# Patient Record
Sex: Female | Born: 1990 | Race: White | Hispanic: No | Marital: Single | Smoking: Current every day smoker
Health system: Southern US, Community
[De-identification: ages and names within clinical notes are randomized; demographics above are authoritative.]

## PROBLEM LIST (undated history)

## (undated) DIAGNOSIS — F329 Major depressive disorder, single episode, unspecified: Secondary | ICD-10-CM

## (undated) DIAGNOSIS — F419 Anxiety disorder, unspecified: Secondary | ICD-10-CM

## (undated) DIAGNOSIS — O039 Complete or unspecified spontaneous abortion without complication: Secondary | ICD-10-CM

## (undated) DIAGNOSIS — F32A Depression, unspecified: Secondary | ICD-10-CM

## (undated) HISTORY — PX: BREAST SURGERY: SHX581

## (undated) HISTORY — DX: Anxiety disorder, unspecified: F41.9

## (undated) HISTORY — DX: Major depressive disorder, single episode, unspecified: F32.9

## (undated) HISTORY — DX: Depression, unspecified: F32.A

## (undated) HISTORY — PX: INDUCED ABORTION: SHX677

---

## 2012-12-14 ENCOUNTER — Emergency Department (HOSPITAL_COMMUNITY)
Admission: EM | Admit: 2012-12-14 | Discharge: 2012-12-15 | Disposition: A | Discharge status: Home / Self Care | Payer: Self-pay | Attending: Emergency Medicine | Admitting: Emergency Medicine

## 2012-12-14 ENCOUNTER — Encounter (HOSPITAL_COMMUNITY): Payer: Self-pay | Admitting: Adult Health

## 2012-12-14 DIAGNOSIS — R0789 Other chest pain: Secondary | ICD-10-CM | POA: Insufficient documentation

## 2012-12-14 DIAGNOSIS — Y9389 Activity, other specified: Secondary | ICD-10-CM | POA: Insufficient documentation

## 2012-12-14 DIAGNOSIS — T43601A Poisoning by unspecified psychostimulants, accidental (unintentional), initial encounter: Secondary | ICD-10-CM | POA: Insufficient documentation

## 2012-12-14 DIAGNOSIS — F411 Generalized anxiety disorder: Secondary | ICD-10-CM | POA: Insufficient documentation

## 2012-12-14 DIAGNOSIS — Y929 Unspecified place or not applicable: Secondary | ICD-10-CM | POA: Insufficient documentation

## 2012-12-14 DIAGNOSIS — R079 Chest pain, unspecified: Secondary | ICD-10-CM | POA: Insufficient documentation

## 2012-12-14 DIAGNOSIS — R259 Unspecified abnormal involuntary movements: Secondary | ICD-10-CM | POA: Insufficient documentation

## 2012-12-14 DIAGNOSIS — F17200 Nicotine dependence, unspecified, uncomplicated: Secondary | ICD-10-CM | POA: Insufficient documentation

## 2012-12-14 DIAGNOSIS — E876 Hypokalemia: Secondary | ICD-10-CM

## 2012-12-14 DIAGNOSIS — R42 Dizziness and giddiness: Secondary | ICD-10-CM | POA: Insufficient documentation

## 2012-12-14 DIAGNOSIS — T43614A Poisoning by caffeine, undetermined, initial encounter: Secondary | ICD-10-CM | POA: Insufficient documentation

## 2012-12-14 DIAGNOSIS — Z3202 Encounter for pregnancy test, result negative: Secondary | ICD-10-CM | POA: Insufficient documentation

## 2012-12-14 DIAGNOSIS — M79609 Pain in unspecified limb: Secondary | ICD-10-CM | POA: Insufficient documentation

## 2012-12-14 NOTE — ED Notes (Addendum)
Presents with chest pain described as burning and heart racing. Reports Meth use yesterday and drinking a redbull and taking caffeine pills today. Pt is tachycardic 120s-160s  and feels SOB and dizzy.  Reports left leg pain and feeling faint.

## 2012-12-15 LAB — CBC
MCV: 78.4 fL (ref 78.0–100.0)
Platelets: 309 10*3/uL (ref 150–400)
RBC: 5.51 MIL/uL — ABNORMAL HIGH (ref 3.87–5.11)
RDW: 12.3 % (ref 11.5–15.5)
WBC: 9.1 10*3/uL (ref 4.0–10.5)

## 2012-12-15 LAB — PREGNANCY, URINE: Preg Test, Ur: NEGATIVE

## 2012-12-15 LAB — BASIC METABOLIC PANEL
CO2: 21 mEq/L (ref 19–32)
Chloride: 98 mEq/L (ref 96–112)
Creatinine, Ser: 0.75 mg/dL (ref 0.50–1.10)
GFR calc Af Amer: >90 mL/min (ref >90)
Potassium: 2.8 mEq/L — ABNORMAL LOW (ref 3.5–5.1)
Sodium: 138 mEq/L (ref 135–145)

## 2012-12-15 LAB — CK TOTAL AND CKMB (NOT AT ARMC)
CK, MB: 2.9 ng/mL (ref 0.3–4.0)
Relative Index: 2 (ref 0.0–2.5)
Total CK: 148 U/L (ref 7–177)

## 2012-12-15 LAB — RAPID URINE DRUG SCREEN, HOSP PERFORMED: Barbiturates: NOT DETECTED

## 2012-12-15 LAB — POCT I-STAT TROPONIN I: Troponin i, poc: 0 ng/mL (ref 0.00–0.08)

## 2012-12-15 MED ORDER — POTASSIUM CHLORIDE 20 MEQ/15ML (10%) PO LIQD
40.0000 meq | Freq: Once | ORAL | Status: AC
  Administered 2012-12-15: 40 meq via ORAL
  Filled 2012-12-15: qty 30

## 2012-12-15 MED ORDER — SODIUM CHLORIDE 0.9 % IV BOLUS (SEPSIS)
1000.0000 mL | Freq: Once | INTRAVENOUS | Status: AC
  Administered 2012-12-15: 1000 mL via INTRAVENOUS

## 2012-12-15 MED ORDER — POTASSIUM CHLORIDE CRYS ER 20 MEQ PO TBCR
40.0000 meq | EXTENDED_RELEASE_TABLET | Freq: Once | ORAL | Status: DC
  Filled 2012-12-15: qty 2

## 2012-12-15 MED ORDER — POTASSIUM CHLORIDE 20 MEQ/15ML (10%) PO LIQD: 40.0000 meq | Freq: Every day | ORAL | Status: DC

## 2012-12-15 MED ORDER — LORAZEPAM 2 MG/ML IJ SOLN
1.0000 mg | Freq: Once | INTRAMUSCULAR | Status: AC
  Administered 2012-12-15: 1 mg via INTRAVENOUS
  Filled 2012-12-15: qty 1

## 2012-12-15 MED ORDER — GI COCKTAIL ~~LOC~~
30.0000 mL | Freq: Once | ORAL | Status: AC
  Administered 2012-12-15: 30 mL via ORAL
  Filled 2012-12-15: qty 30

## 2012-12-15 MED ORDER — POTASSIUM CHLORIDE 10 MEQ/100ML IV SOLN
10.0000 meq | INTRAVENOUS | Status: DC
  Administered 2012-12-15: 10 meq via INTRAVENOUS
  Filled 2012-12-15: qty 100

## 2012-12-15 NOTE — ED Provider Notes (Signed)
History     CSN: [id number]  Arrival date & time 12/14/12  2333   First MD Initiated Contact with Patient 12/14/12 2355      Chief Complaint  Patient presents with  . Tachycardia    (Consider location/radiation/quality/duration/timing/severity/associated sxs/prior treatment) HPI 22 yo female presents to the ER from home with complaint of shortness of breath, chest pain/tightness and racing heart starting about 930 pm tonight.  Pt reports taking a "fatburner" B4 and drinking a 8 oz redbull with it.  About an hour after taking it she developed symptoms.  Pt feels very dizzy, like she is going to pass out.  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  She reports some left leg pain and twitching throughout the day today, no weakness, no skin changes, no injury, no swelling.  History reviewed. No pertinent past medical history.  History reviewed. No pertinent past surgical history.  History reviewed. No pertinent family history.  History  Substance Use Topics  . Smoking status: Current Every Day Smoker    Types: Cigarettes  . Smokeless tobacco: Not on file  . Alcohol Use: Yes    OB History   Grav Para Term Preterm Abortions TAB SAB Ect Mult Living                  Review of Systems  See History of Present Illness; otherwise all other systems are reviewed and negative Allergies  Morphine and related and Percocet  Home Medications  No current outpatient prescriptions on file.  BP 144/120  Pulse 93  Temp(Src) 98.1 F (36.7 C) (Oral)  Resp 14  SpO2 100%  Physical Exam  Nursing note and vitals reviewed. Constitutional: She is oriented to person, place, and time. She appears well-developed and well-nourished. She appears distressed.  HENT:  Head: Normocephalic and atraumatic.  Right Ear: External ear normal.  Left Ear: External ear normal.  Nose: Nose normal.  Mouth/Throat: Oropharynx is clear and moist.  Eyes: Conjunctivae and EOM are normal. Pupils are equal,  round, and reactive to light.  Neck: Normal range of motion. Neck supple. No JVD present. No tracheal deviation present. No thyromegaly present.  Cardiovascular: Regular rhythm, normal heart sounds and intact distal pulses.  Exam reveals no gallop and no friction rub.   No murmur heard. Tachycardia and hypertension noted  Pulmonary/Chest: Effort normal and breath sounds normal. No stridor. No respiratory distress. She has no wheezes. She has no rales. She exhibits no tenderness.  Abdominal: Soft. Bowel sounds are normal. She exhibits no distension and no mass. There is tenderness (mild epigastric tenderness). There is no rebound and no guarding.  Musculoskeletal: Normal range of motion. She exhibits no edema and no tenderness.  Lymphadenopathy:    She has no cervical adenopathy.  Neurological: She is alert and oriented to person, place, and time. No cranial nerve deficit. She exhibits normal muscle tone. Coordination normal.  Skin: Skin is warm and dry. No rash noted. No erythema. No pallor.  Psychiatric: Her behavior is normal. Judgment and thought content normal.  Patient is extremely anxious    ED Course  Procedures (including critical care time)  Labs Reviewed  CBC - Abnormal; Notable for the following:    RBC 5.51 (*)    Hemoglobin 16.6 (*)    MCHC 38.4 (*)    All other components within normal limits  BASIC METABOLIC PANEL - Abnormal; Notable for the following:    Potassium 2.8 (*)    Glucose, Bld  129 (*)    Calcium 11.0 (*)    All other components within normal limits  URINE RAPID DRUG SCREEN (HOSP PERFORMED) - Abnormal; Notable for the following:    Amphetamines POSITIVE (*)    All other components within normal limits  CK TOTAL AND CKMB  PREGNANCY, URINE  POCT I-STAT TROPONIN I   No results found.   Date: 12/15/2012  Rate: 127  Rhythm: sinus tachycardia  QRS Axis: normal  Intervals: QT prolonged  ST/T Wave abnormalities: normal  Conduction Disutrbances:none   Narrative Interpretation:   Old EKG Reviewed: none available    1. Caffeine toxicity, initial encounter   2. Hypokalemia       MDM  Patient with tachycardia, shortness of breath, and dizziness after ingesting caffeine drink of rectal and a fat burner B4 that has niacin, yohimbe, and caffeine among other ingredients.  Will check labs, give ivf and ativan.       Olivia Mackie, MD 12/15/12 [phone]

## 2013-12-15 ENCOUNTER — Ambulatory Visit: Payer: Self-pay | Admitting: Family Medicine

## 2013-12-15 VITALS — BP 120/72 | HR 92 | Temp 98.3°F | Resp 18 | Ht 63.0 in | Wt 113.0 lb

## 2013-12-15 DIAGNOSIS — N938 Other specified abnormal uterine and vaginal bleeding: Secondary | ICD-10-CM

## 2013-12-15 DIAGNOSIS — N925 Other specified irregular menstruation: Secondary | ICD-10-CM

## 2013-12-15 DIAGNOSIS — N72 Inflammatory disease of cervix uteri: Secondary | ICD-10-CM

## 2013-12-15 DIAGNOSIS — Z3041 Encounter for surveillance of contraceptive pills: Secondary | ICD-10-CM

## 2013-12-15 DIAGNOSIS — N949 Unspecified condition associated with female genital organs and menstrual cycle: Secondary | ICD-10-CM

## 2013-12-15 LAB — POCT URINALYSIS DIPSTICK
Glucose, UA: NEGATIVE
Leukocytes, UA: NEGATIVE
Nitrite, UA: NEGATIVE
PH UA: 6
PROTEIN UA: 100
Urobilinogen, UA: 0.2

## 2013-12-15 LAB — POCT WET PREP WITH KOH
Clue Cells Wet Prep HPF POC: NEGATIVE
KOH PREP POC: NEGATIVE
Trichomonas, UA: NEGATIVE
WBC WET PREP PER HPF POC: NEGATIVE
YEAST WET PREP PER HPF POC: NEGATIVE

## 2013-12-15 LAB — POCT UA - MICROSCOPIC ONLY
CASTS, UR, LPF, POC: NEGATIVE
Crystals, Ur, HPF, POC: NEGATIVE
MUCUS UA: POSITIVE
RBC, urine, microscopic: NEGATIVE
YEAST UA: NEGATIVE

## 2013-12-15 MED ORDER — METRONIDAZOLE 0.75 % VA GEL: 1.0000 | Freq: Two times a day (BID) | VAGINAL | Status: DC

## 2013-12-15 NOTE — Progress Notes (Signed)
Subjective: 23 year old lady who is on Sprintec for birth control. She had an abortion in December. Since onset of her last pack of pills almost a month ago she is continued bleeding. She has one regular sexual partner. She is not in school, works as a Horticulturist, commercialdancer. She has a little more bleeding after sexual activity. No major abdominal pains. Her urine has been dark. He notices an odor to her discharge sometimes. She has a history of BV.  Objective: Slightly dark urine. Abdomen soft without masses or tenderness. No CVA tenderness. Throat normal external genitalia. Vaginal mucosa unremarkable except for bloody mucus discharge. The cervix has a little erythematous area about the 10:00 position which is a little friable. This may be where the blood is coming from because the mucus up inside the os does not look real bloody. Wet prep was taken. Bimanual exam reveals no adnexal or uterine masses.  Assessment: Dysfunction uterine bleeding Mild cervicitis  Plan: Check Pap 3, wet prep, and urine  Results for orders placed in visit on 12/15/13  POCT URINALYSIS DIPSTICK      Result Value Ref Range   Color, UA dark yellow     Clarity, UA clear     Glucose, UA negative     Bilirubin, UA small     Ketones, UA trace     Spec Grav, UA >=1.030     Blood, UA trace-intact     pH, UA 6.0     Protein, UA 100     Urobilinogen, UA 0.2     Nitrite, UA negative     Leukocytes, UA Negative    POCT UA - MICROSCOPIC ONLY      Result Value Ref Range   WBC, Ur, HPF, POC 0-2     RBC, urine, microscopic negative     Bacteria, U Microscopic trace     Mucus, UA positive     Epithelial cells, urine per micros 3-5     Crystals, Ur, HPF, POC negative     Casts, Ur, LPF, POC negative     Yeast, UA negative    POCT WET PREP WITH KOH      Result Value Ref Range   Trichomonas, UA Negative     Clue Cells Wet Prep HPF POC negative     Epithelial Wet Prep HPF POC 0-1     Yeast Wet Prep HPF POC negative     Bacteria Wet  Prep HPF POC trace     RBC Wet Prep HPF POC 2-3     WBC Wet Prep HPF POC negative     KOH Prep POC Negative     If symptoms persist into next. Then would need to do further workup with regard to the bleeding.  Gave her MetroGel vaginal because of that inflamed area on the cervix.

## 2013-12-15 NOTE — Patient Instructions (Signed)
Use the MetroGel vaginal one application twice daily and vagina for cervical inflammation   See what the bleeding does after the upcoming period.  If it persists may require GYN referral.  All in a few days for results of your tests

## 2013-12-19 LAB — PAP IG, CT-NG, RFX HPV ASCU
CHLAMYDIA PROBE AMP: NEGATIVE
GC Probe Amp: NEGATIVE

## 2013-12-20 LAB — HUMAN PAPILLOMAVIRUS, HIGH RISK: HPV DNA HIGH RISK: DETECTED — AB

## 2013-12-26 ENCOUNTER — Other Ambulatory Visit: Payer: Self-pay | Admitting: Family Medicine

## 2013-12-26 DIAGNOSIS — R87619 Unspecified abnormal cytological findings in specimens from cervix uteri: Secondary | ICD-10-CM

## 2013-12-27 ENCOUNTER — Encounter: Payer: Self-pay | Admitting: *Deleted

## 2014-01-10 ENCOUNTER — Telehealth: Payer: Self-pay | Admitting: Physician Assistant

## 2014-01-10 NOTE — Telephone Encounter (Signed)
Spoke with the patient's father who states that [place] the [place]. I asked that he deliver the message that we are trying to reach her, and to please call this office.

## 2014-01-10 NOTE — Telephone Encounter (Signed)
Message copied by Fernande BrasJEFFERY, Marilena Trevathan S on Wed Jan 10, 2014  6:59 PM ------      Message from: HOPPER, DAVID H      Created: Mon Dec 25, 2013  3:46 PM       Abnormal Pap smear. Asking PA Porfirio Oarhelle Jeison Delpilar if she will handle it for me. ------

## 2014-01-23 NOTE — Telephone Encounter (Signed)
I've made multiple calls to this patient (her VM is not set up, and I've left messages at her father's number x3), as well as 2 My Chart messages.  How would you like to proceed?

## 2014-01-23 NOTE — Telephone Encounter (Signed)
Patient's father is returning our phone call because patient is in [place].   Best#: [phone]

## 2014-01-25 ENCOUNTER — Encounter: Payer: Self-pay | Admitting: Family Medicine

## 2014-01-25 NOTE — Telephone Encounter (Signed)
Patient was sent a letter with this information. I have recommended she see a gynecologist.

## 2014-04-04 ENCOUNTER — Emergency Department (HOSPITAL_COMMUNITY)
Admission: EM | Admit: 2014-04-04 | Discharge: 2014-04-04 | Disposition: A | Discharge status: Home / Self Care | Payer: Self-pay

## 2014-04-04 MED ORDER — ONDANSETRON HCL 4 MG/2ML IJ SOLN: 4.0000 mg | Freq: Once | INTRAMUSCULAR | Status: DC

## 2014-04-04 MED ORDER — ONDANSETRON 8 MG PO TBDP: 8.0000 mg | ORAL_TABLET | Freq: Once | ORAL | Status: DC

## 2014-04-04 NOTE — ED Notes (Signed)
Called pt x1 to obtain labs, pt cannot be found in lobby.

## 2014-04-04 NOTE — ED Notes (Signed)
Called for pt x 2. No answer.

## 2015-02-13 ENCOUNTER — Encounter: Payer: Self-pay | Admitting: Internal Medicine

## 2015-02-13 ENCOUNTER — Ambulatory Visit (INDEPENDENT_AMBULATORY_CARE_PROVIDER_SITE_OTHER): Payer: Self-pay | Admitting: Internal Medicine

## 2015-02-13 VITALS — BP 114/60 | HR 103 | Temp 98.8°F | Ht 63.0 in | Wt 111.0 lb

## 2015-02-13 DIAGNOSIS — F32A Depression, unspecified: Secondary | ICD-10-CM

## 2015-02-13 DIAGNOSIS — F329 Major depressive disorder, single episode, unspecified: Secondary | ICD-10-CM | POA: Insufficient documentation

## 2015-02-13 DIAGNOSIS — F419 Anxiety disorder, unspecified: Principal | ICD-10-CM

## 2015-02-13 DIAGNOSIS — F988 Other specified behavioral and emotional disorders with onset usually occurring in childhood and adolescence: Secondary | ICD-10-CM | POA: Insufficient documentation

## 2015-02-13 DIAGNOSIS — F418 Other specified anxiety disorders: Secondary | ICD-10-CM

## 2015-02-13 DIAGNOSIS — F909 Attention-deficit hyperactivity disorder, unspecified type: Secondary | ICD-10-CM

## 2015-02-13 NOTE — Patient Instructions (Signed)

## 2015-02-13 NOTE — Assessment & Plan Note (Signed)
History of but not active She is not interested in treatment at this time Will continue to monitor

## 2015-02-13 NOTE — Progress Notes (Signed)
HPI  Pt presents to the clinic today to establish care and for management of the conditions listed below. She is transferring care from her previous PCP in [place], [place].  Anxiety and Depression: This is triggered by stress of everyday life. She does not feel like she has trouble "dealing" with it. She reports she was treated for postpartum depression after the birth of her child. She was prescribed medication but did not take them. She denies current anxiety or depression. She denies SI/HI.  She reports she is concerned her ADD is "coming back". She is very antsy and feels like she is procrastinating all the time. She has trouble paying her bills on time, she has trouble focusing on things that she was supposed to do. She reports she was diagnosed as a child. She has been on many different medications but stopped taking them because she didn't want to takes pills every day.  Flu: never Tetanus: 2005 LMP: 02/02/2015, irregular, OCP makes it worse Pap Smear: 2015 Dentist: as needed  Past Medical History  Diagnosis Date  . Anxiety   . Depression     No current outpatient prescriptions on file.   No current facility-administered medications for this visit.    Allergies  Allergen Reactions  . Morphine And Related Itching  . Percocet [Oxycodone-Acetaminophen] Itching  . Hydrocodone-Acetaminophen Hives    Family History  Problem Relation Age of Onset  . Cancer Father     melanoma  . Heart murmur Mother   . Cancer Maternal Grandfather     Lung    History   Social History  . Marital Status: Single    Spouse Name: N/A  . Number of Children: N/A  . Years of Education: N/A   Occupational History  . Not on file.   Social History Main Topics  . Smoking status: Current Every Day Smoker -- 0.50 packs/day    Types: Cigarettes  . Smokeless tobacco: Never Used  . Alcohol Use: 0.0 oz/week    0 Standard drinks or equivalent per week     Comment: occasional  . Drug Use: No  .  Sexual Activity: Not on file   Other Topics Concern  . Not on file   Social History Narrative    ROS:  Constitutional: Denies fever, malaise, fatigue, headache or abrupt weight changes.  HEENT: Denies eye pain, eye redness, ear pain, ringing in the ears, wax buildup, runny nose, nasal congestion, bloody nose, or sore throat. Respiratory: Denies difficulty breathing, shortness of breath, cough or sputum production.   Cardiovascular: Denies chest pain, chest tightness, palpitations or swelling in the hands or feet.  Gastrointestinal: Denies abdominal pain, bloating, constipation, diarrhea or blood in the stool.  GU: Denies frequency, urgency, pain with urination, blood in urine, odor or discharge. Musculoskeletal: Denies decrease in range of motion, difficulty with gait, muscle pain or joint pain and swelling.  Skin: Denies redness, rashes, lesions or ulcercations.  Neurological: Denies dizziness, difficulty with memory, difficulty with speech or problems with balance and coordination.  Psych: Denies anxiety, depression, SI/HI.  No other specific complaints in a complete review of systems (except as listed in HPI above).  PE:  BP 114/60 mmHg  Pulse 103  Temp(Src) 98.8 F (37.1 C) (Oral)  Ht  (1.6 m)  Wt 111 lb (50.349 kg)  BMI 19.67 kg/m2  SpO2 98%  LMP 02/02/2015 Wt Readings from Last 3 Encounters:  02/13/15 111 lb (50.349 kg)  12/15/13 113 lb (51.256 kg)  General: Appears her stated age, well developed, well nourished in NAD. Skin: Warm, dry and intact. Cardiovascular: Normal rate and rhythm. S1,S2 noted.  No murmur, rubs or gallops noted.  Pulmonary/Chest: Normal effort and positive vesicular breath sounds. No respiratory distress. No wheezes, rales or ronchi noted.  Neurological: Alert and oriented.  Psychiatric: Mood and affect normal. Behavior is normal. Judgment and thought content normal.   BMET    Component Value Date/Time   NA 138 12/14/2012 2352   K  2.8* 12/14/2012 2352   CL 98 12/14/2012 2352   CO2 21 12/14/2012 2352   GLUCOSE 129* 12/14/2012 2352   BUN 6 12/14/2012 2352   CREATININE 0.75 12/14/2012 2352   CALCIUM 11.0* 12/14/2012 2352   GFRNONAA >90 12/14/2012 2352   GFRAA >90 12/14/2012 2352    Lipid Panel  No results found for: CHOL, TRIG, HDL, CHOLHDL, VLDL, LDLCALC  CBC    Component Value Date/Time   WBC 9.1 12/14/2012 2352   RBC 5.51* 12/14/2012 2352   HGB 16.6* 12/14/2012 2352   HCT 43.2 12/14/2012 2352   PLT 309 12/14/2012 2352   MCV 78.4 12/14/2012 2352   MCH 30.1 12/14/2012 2352   MCHC 38.4* 12/14/2012 2352   RDW 12.3 12/14/2012 2352    Hgb A1C No results found for: HGBA1C   Assessment and Plan:

## 2015-02-13 NOTE — Assessment & Plan Note (Signed)
Advised her I would have to get her records from previous PCP before I start her on any medications Medical record form signed today If no records available, will send her to psychology for repeat testing Will follow up with her after her records come back

## 2015-04-19 ENCOUNTER — Telehealth: Payer: Self-pay

## 2015-04-19 ENCOUNTER — Other Ambulatory Visit: Payer: Self-pay | Admitting: Internal Medicine

## 2015-04-19 DIAGNOSIS — R41840 Attention and concentration deficit: Secondary | ICD-10-CM

## 2015-04-19 NOTE — Telephone Encounter (Signed)
I do not see the records. Can we fax the release again?

## 2015-04-19 NOTE — Telephone Encounter (Signed)
Pt states she does want the referral so she can get evaluated

## 2015-04-19 NOTE — Telephone Encounter (Signed)
Pt left v/m; pt was seen to establish care on 02/13/15 and pt wants to know if medical records from previous pediatrician was received by Nicki Reaper NP for review re: ADD. Pt request cb. (do not see records listed under media tab.)

## 2015-04-19 NOTE — Telephone Encounter (Signed)
Referral placed.

## 2015-06-07 ENCOUNTER — Emergency Department
Admission: EM | Admit: 2015-06-07 | Discharge: 2015-06-07 | Disposition: A | Discharge status: Home / Self Care | Payer: Self-pay | Attending: Emergency Medicine | Admitting: Emergency Medicine

## 2015-06-07 ENCOUNTER — Encounter: Payer: Self-pay | Admitting: *Deleted

## 2015-06-07 ENCOUNTER — Emergency Department: Payer: Self-pay

## 2015-06-07 DIAGNOSIS — O209 Hemorrhage in early pregnancy, unspecified: Secondary | ICD-10-CM | POA: Insufficient documentation

## 2015-06-07 DIAGNOSIS — Z3A01 Less than 8 weeks gestation of pregnancy: Secondary | ICD-10-CM | POA: Insufficient documentation

## 2015-06-07 DIAGNOSIS — O99331 Smoking (tobacco) complicating pregnancy, first trimester: Secondary | ICD-10-CM | POA: Insufficient documentation

## 2015-06-07 DIAGNOSIS — F17210 Nicotine dependence, cigarettes, uncomplicated: Secondary | ICD-10-CM | POA: Insufficient documentation

## 2015-06-07 HISTORY — DX: Complete or unspecified spontaneous abortion without complication: O03.9

## 2015-06-07 LAB — CBC WITH DIFFERENTIAL/PLATELET
BASOS ABS: 0 10*3/uL (ref 0–0.1)
BASOS PCT: 1 %
Eosinophils Absolute: 0.1 10*3/uL (ref 0–0.7)
Eosinophils Relative: 1 %
HEMATOCRIT: 40.7 % (ref 35.0–47.0)
HEMOGLOBIN: 14.4 g/dL (ref 12.0–16.0)
LYMPHS PCT: 23 %
Lymphs Abs: 1.8 10*3/uL (ref 1.0–3.6)
MCH: 31.6 pg (ref 26.0–34.0)
MCHC: 35.3 g/dL (ref 32.0–36.0)
MCV: 89.5 fL (ref 80.0–100.0)
MONO ABS: 0.5 10*3/uL (ref 0.2–0.9)
Monocytes Relative: 6 %
NEUTROS ABS: 5.5 10*3/uL (ref 1.4–6.5)
NEUTROS PCT: 69 %
Platelets: 191 10*3/uL (ref 150–440)
RBC: 4.55 MIL/uL (ref 3.80–5.20)
RDW: 12.6 % (ref 11.5–14.5)
WBC: 8 10*3/uL (ref 3.6–11.0)

## 2015-06-07 LAB — URINALYSIS COMPLETE WITH MICROSCOPIC (ARMC ONLY): SPECIFIC GRAVITY, URINE: 1.014 (ref 1.005–1.030)

## 2015-06-07 LAB — WET PREP, GENITAL
Clue Cells Wet Prep HPF POC: NONE SEEN
Trich, Wet Prep: NONE SEEN
Yeast Wet Prep HPF POC: NONE SEEN

## 2015-06-07 LAB — CHLAMYDIA/NGC RT PCR (ARMC ONLY)
Chlamydia Tr: NOT DETECTED
N gonorrhoeae: NOT DETECTED

## 2015-06-07 LAB — BASIC METABOLIC PANEL
ANION GAP: 7 (ref 5–15)
BUN: 8 mg/dL (ref 6–20)
CHLORIDE: 104 mmol/L (ref 101–111)
CO2: 26 mmol/L (ref 22–32)
Calcium: 9.4 mg/dL (ref 8.9–10.3)
Creatinine, Ser: 0.69 mg/dL (ref 0.44–1.00)
GFR calc non Af Amer: >60 mL/min (ref >60)
Glucose, Bld: 91 mg/dL (ref 65–99)
Potassium: 3.6 mmol/L (ref 3.5–5.1)
Sodium: 137 mmol/L (ref 135–145)

## 2015-06-07 LAB — HCG, QUANTITATIVE, PREGNANCY: HCG, BETA CHAIN, QUANT, S: 9444 m[IU]/mL — AB (ref <5)

## 2015-06-07 LAB — ABO/RH: ABO/RH(D): A POS

## 2015-06-07 LAB — POCT PREGNANCY, URINE: PREG TEST UR: POSITIVE — AB

## 2015-06-07 MED ORDER — CEPHALEXIN 500 MG PO CAPS: 500.0000 mg | ORAL_CAPSULE | Freq: Three times a day (TID) | ORAL | Status: AC

## 2015-06-07 MED ORDER — CEPHALEXIN 500 MG PO CAPS
500.0000 mg | ORAL_CAPSULE | Freq: Once | ORAL | Status: AC
  Administered 2015-06-07: 500 mg via ORAL
  Filled 2015-06-07: qty 1

## 2015-06-07 MED ORDER — PRENATAL MULTIVITAMIN CH: 1.0000 | ORAL_TABLET | Freq: Every day | ORAL | Status: DC

## 2015-06-07 NOTE — ED Notes (Signed)
Patient transported to US via stretcher.

## 2015-06-07 NOTE — ED Notes (Signed)
Positive pregnancy test 2.5 weeks ago.  Reports abdominal cramping and bleeding that started 3 days ago.  LMP: Late august.

## 2015-06-07 NOTE — ED Notes (Signed)
MD at bedside. 

## 2015-06-07 NOTE — Discharge Instructions (Signed)

## 2015-06-07 NOTE — ED Notes (Signed)
Lab notified to add on hcg pregnancy blood test to blood already in lab.

## 2015-06-07 NOTE — ED Notes (Signed)
POCT urine pregnancy= POSITIVE.

## 2015-06-07 NOTE — ED Provider Notes (Signed)
[facility] Emergency Department Provider Note  ____________________________________________  Time seen: Approximately 7 PM  I have reviewed the triage vital signs and the nursing notes.   HISTORY  Chief Complaint Miscarriage    HPI Jade Mills is a 24 y.o. female who is a G4 P1 who is presenting today with lower abdominal cramping and vaginal bleeding over the past 2 days. She had her last regular period in late August. She says that she took a positive pregnancy test 2-1/2 weeks ago. However, she has not had any issue until last 2 days. She denies any nausea or vomiting. No history of sexually transmitted diseases. No history of ectopic pregnancy. She has one daughter. She has had a miscarriage and also an elective termination. Denies any dysuria. Says the bleeding started out as spotting 2 days ago and then progressed to several clots earlier today. She said that the pain was a cramping quality into her lower abdomen. She is not having any pain at this time and says she was doubled over earlier today intermittently. The pain radiated to her back. Does not have a local OB/GYN.Is not taking any medications chronically at this time. Is not taking prenatal vitamins. Says smokes about a half-pack of cigarettes a day. No other drugs or alcohol.   Past Medical History  Diagnosis Date  . Anxiety   . Depression   . Miscarriage     Patient Active Problem List   Diagnosis Date Noted  . Anxiety and depression 02/13/2015  . ADD (attention deficit disorder) 02/13/2015    Past Surgical History  Procedure Laterality Date  . Breast surgery    . Induced abortion      No current outpatient prescriptions on file.  Allergies Morphine and related; Percocet; and Hydrocodone-acetaminophen  Family History  Problem Relation Age of Onset  . Cancer Father     melanoma  . Heart murmur Mother   . Cancer Maternal Grandfather     Lung    Social History Social History   Substance Use Topics  . Smoking status: Current Every Day Smoker -- 0.50 packs/day for 4 years    Types: Cigarettes  . Smokeless tobacco: Never Used  . Alcohol Use: 0.0 oz/week    0 Standard drinks or equivalent per week     Comment: occasional    Review of Systems Constitutional: No fever/chills Eyes: No visual changes. ENT: No sore throat. Cardiovascular: Denies chest pain. Respiratory: Denies shortness of breath. Gastrointestinal:  No nausea, no vomiting.  No diarrhea.  No constipation. Genitourinary: Negative for dysuria. Musculoskeletal: As above Skin: Negative for rash. Neurological: Negative for headaches, focal weakness or numbness.  10-point ROS otherwise negative.  ____________________________________________   PHYSICAL EXAM:  VITAL SIGNS: ED Triage Vitals  Enc Vitals Group     BP 06/07/15 1853 116/68 mmHg     Pulse Rate 06/07/15 1853 90     Resp 06/07/15 1853 16     Temp 06/07/15 1853 98.3 F (36.8 C)     Temp Source 06/07/15 1853 Oral     SpO2 06/07/15 1853 100 %     Weight 06/07/15 1853 110 lb (49.896 kg)     Height 06/07/15 1853  (1.6 m)     Head Cir --      Peak Flow --      Pain Score 06/07/15 1857 3     Pain Loc --      Pain Edu? --      Excl. in  GC? --     Constitutional: Alert and oriented. Well appearing and in no acute distress. Eyes: Conjunctivae are normal. PERRL. EOMI. Head: Atraumatic. Nose: No congestion/rhinnorhea. Mouth/Throat: Mucous membranes are moist.  Oropharynx non-erythematous. Neck: No stridor.   Cardiovascular: Normal rate, regular rhythm. Grossly normal heart sounds.  Good peripheral circulation. Respiratory: Normal respiratory effort.  No retractions. Lungs CTAB. Gastrointestinal: Soft and nontender. No distention. No abdominal bruits. No CVA tenderness. Genitourinary: Normal external exam. Speculum exam with mild to moderate amount of blood in the vault. Small amount of bleeding from the cervix. Blood is maroon,  nonpulsatile are brisk. Bimanual exam with mild CMT as well as uterine and bilateral adnexal tenderness palpation. There is no bogginess to the uterus.  No masses palpated. Musculoskeletal: No lower extremity tenderness nor edema.  No joint effusions. Neurologic:  Normal speech and language. No gross focal neurologic deficits are appreciated. No gait instability. Skin:  Skin is warm, dry and intact. No rash noted. Psychiatric: Mood and affect are normal. Speech and behavior are normal.  ____________________________________________   LABS (all labs ordered are listed, but only abnormal results are displayed)  Labs Reviewed  WET PREP, GENITAL - Abnormal; Notable for the following:    WBC, Wet Prep HPF POC MODERATE (*)    All other components within normal limits  URINALYSIS COMPLETEWITH MICROSCOPIC ([facility] ONLY) - Abnormal; Notable for the following:    Color, Urine RED (*)    APPearance CLOUDY (*)    Glucose, UA   (*)    Value: TEST NOT REPORTED DUE TO COLOR INTERFERENCE OF URINE PIGMENT   Bilirubin Urine   (*)    Value: TEST NOT REPORTED DUE TO COLOR INTERFERENCE OF URINE PIGMENT   Ketones, ur   (*)    Value: TEST NOT REPORTED DUE TO COLOR INTERFERENCE OF URINE PIGMENT   Hgb urine dipstick   (*)    Value: TEST NOT REPORTED DUE TO COLOR INTERFERENCE OF URINE PIGMENT   Protein, ur   (*)    Value: TEST NOT REPORTED DUE TO COLOR INTERFERENCE OF URINE PIGMENT   Nitrite   (*)    Value: TEST NOT REPORTED DUE TO COLOR INTERFERENCE OF URINE PIGMENT   Leukocytes, UA   (*)    Value: TEST NOT REPORTED DUE TO COLOR INTERFERENCE OF URINE PIGMENT   Bacteria, UA FEW (*)    Squamous Epithelial / LPF 6-30 (*)    All other components within normal limits  HCG, QUANTITATIVE, PREGNANCY - Abnormal; Notable for the following:    hCG, Beta Chain, Quant, S 9444 (*)    All other components within normal limits  POCT PREGNANCY, URINE - Abnormal; Notable for the following:    Preg Test, Ur POSITIVE (*)     All other components within normal limits  CHLAMYDIA/NGC RT PCR ([facility] ONLY)  CBC WITH DIFFERENTIAL/PLATELET  BASIC METABOLIC PANEL  POC URINE PREG, ED  ABO/RH   ____________________________________________  EKG   ____________________________________________  RADIOLOGY  No intrauterine pregnancy found on the ultrasound. Differential includes pregnancy too early to detect versus missed abortion versus ectopic. ____________________________________________   PROCEDURES    ____________________________________________   INITIAL IMPRESSION / ASSESSMENT AND PLAN / ED COURSE  Pertinent labs & imaging results that were available during my care of the patient were reviewed by me and considered in my medical decision making (see chart for details).  ----------------------------------------- 11:25 PM on 06/07/2015 -----------------------------------------  Patient is resting comfortably at this time. No abdominal pain at  this time. Given the patient's story she has likely miscarried the pregnancy. We discussed further that when she says that she passed tissue she said that she passed a small cauliflower-like object earlier today. From her description sounds like this could've been the pregnancy. Despite this, she will need to follow up with OB/GYN. I discussed her calling OB/GYN first thing Monday morning for repeat hormone level. She also knows return precautions including any worsening belly pain or bleeding. The patient highly contaminated urine sample however given her saying that she had recent urinary frequency and has to numerous to count white blood cells elevated treated for UTI. While it is still the differential it is unlikely that the patient is presenting with ectopic pregnancy because she is pain-free at this time. The patient understands the plan and is willing to comply. We reviewed all the lab tests as well as ultrasound  findings. ____________________________________________   FINAL CLINICAL IMPRESSION(S) / ED DIAGNOSES  Acute vaginal bleeding in pregnancy. Initial visit.    Myrna Blazer, MD 06/07/15 [phone]

## 2015-06-07 NOTE — ED Notes (Signed)
MD at bedside for reeval

## 2015-06-07 NOTE — ED Notes (Signed)
Assumed pt care at this time. NAD noted. RR even and nonlabored. Will continue to monitor. 

## 2015-06-11 LAB — URINE CULTURE

## 2015-07-02 ENCOUNTER — Ambulatory Visit (INDEPENDENT_AMBULATORY_CARE_PROVIDER_SITE_OTHER): Payer: Self-pay | Admitting: Internal Medicine

## 2015-07-02 ENCOUNTER — Other Ambulatory Visit: Payer: Self-pay | Admitting: Internal Medicine

## 2015-07-02 ENCOUNTER — Encounter: Payer: Self-pay | Admitting: Internal Medicine

## 2015-07-02 VITALS — BP 112/70 | HR 76 | Temp 98.3°F | Wt 111.0 lb

## 2015-07-02 DIAGNOSIS — F909 Attention-deficit hyperactivity disorder, unspecified type: Secondary | ICD-10-CM

## 2015-07-02 DIAGNOSIS — O039 Complete or unspecified spontaneous abortion without complication: Secondary | ICD-10-CM

## 2015-07-02 DIAGNOSIS — F988 Other specified behavioral and emotional disorders with onset usually occurring in childhood and adolescence: Secondary | ICD-10-CM

## 2015-07-02 NOTE — Progress Notes (Signed)
Pre visit review using our clinic review tool, if applicable. No additional management support is needed unless otherwise documented below in the visit note. 

## 2015-07-02 NOTE — Patient Instructions (Signed)
Miscarriage  A miscarriage is the sudden loss of an unborn baby (fetus) before the 20th week of pregnancy. Most miscarriages happen in the first 3 months of pregnancy. Sometimes, it happens before a woman even knows she is pregnant. A miscarriage is also called a "spontaneous miscarriage" or "early pregnancy loss." Having a miscarriage can be an emotional experience. Talk with your caregiver about any questions you may have about miscarrying, the grieving process, and your future pregnancy plans.  CAUSES    Problems with the fetal chromosomes that make it impossible for the baby to develop normally. Problems with the baby's genes or chromosomes are most often the result of errors that occur, by chance, as the embryo divides and grows. The problems are not inherited from the parents.   Infection of the cervix or uterus.    Hormone problems.    Problems with the cervix, such as having an incompetent cervix. This is when the tissue in the cervix is not strong enough to hold the pregnancy.    Problems with the uterus, such as an abnormally shaped uterus, uterine fibroids, or congenital abnormalities.    Certain medical conditions.    Smoking, drinking alcohol, or taking illegal drugs.    Trauma.   Often, the cause of a miscarriage is unknown.   SYMPTOMS    Vaginal bleeding or spotting, with or without cramps or pain.   Pain or cramping in the abdomen or lower back.   Passing fluid, tissue, or blood clots from the vagina.  DIAGNOSIS   Your caregiver will perform a physical exam. You may also have an ultrasound to confirm the miscarriage. Blood or urine tests may also be ordered.  TREATMENT    Sometimes, treatment is not necessary if you naturally pass all the fetal tissue that was in the uterus. If some of the fetus or placenta remains in the body (incomplete miscarriage), tissue left behind may become infected and must be removed. Usually, a dilation and curettage (D and C) procedure is performed.  During a D and C procedure, the cervix is widened (dilated) and any remaining fetal or placental tissue is gently removed from the uterus.   Antibiotic medicines are prescribed if there is an infection. Other medicines may be given to reduce the size of the uterus (contract) if there is a lot of bleeding.   If you have Rh negative blood and your baby was Rh positive, you will need a Rh immunoglobulin shot. This shot will protect any future baby from having Rh blood problems in future pregnancies.  HOME CARE INSTRUCTIONS    Your caregiver may order bed rest or may allow you to continue light activity. Resume activity as directed by your caregiver.   Have someone help with home and family responsibilities during this time.    Keep track of the number of sanitary pads you use each day and how soaked (saturated) they are. Write down this information.    Do not use tampons. Do not douche or have sexual intercourse until approved by your caregiver.    Only take over-the-counter or prescription medicines for pain or discomfort as directed by your caregiver.    Do not take aspirin. Aspirin can cause bleeding.    Keep all follow-up appointments with your caregiver.    If you or your partner have problems with grieving, talk to your caregiver or seek counseling to help cope with the pregnancy loss. Allow enough time to grieve before trying to get pregnant again.     SEEK IMMEDIATE MEDICAL CARE IF:    You have severe cramps or pain in your back or abdomen.   You have a fever.   You pass large blood clots (walnut-sized or larger) ortissue from your vagina. Save any tissue for your caregiver to inspect.    Your bleeding increases.    You have a thick, bad-smelling vaginal discharge.   You become lightheaded, weak, or you faint.    You have chills.   MAKE SURE YOU:   Understand these instructions.   Will watch your condition.   Will get help right away if you are not doing well or get worse.     This  information is not intended to replace advice given to you by your health care provider. Make sure you discuss any questions you have with your health care provider.     Document Released: 02/03/2001 Document Revised: 12/05/2012 Document Reviewed: 09/29/2011  [employer]vier Interactive Patient Education 2016 [employer]

## 2015-07-02 NOTE — Assessment & Plan Note (Signed)
Advised her I could not treat her without a psychologist evaluation or previous evaluation She does not want to wait on records She will see Revonda Standardllison on the way out to schedule her psychology evaluation

## 2015-07-02 NOTE — Progress Notes (Signed)
Subjective:    Patient ID: Jade Mills, female    DOB: 1991/07/08, 24 y.o.   MRN: [id number]  HPI  Pt presents to the clinic today for follow up of ADD. She reports that she was diagnosed with this and treated as a child. She was interested in restarting treatment but could not find her records (because they are in storage), so she was referred to psych for evaluation of ADD. She never saw Dr. Jeannine Kitten, she reports no one ever called her with an appt. She is very antsy and feels like she is procrastinating all the time. She has trouble paying her bills on time, she has trouble focusing on things that she was supposed to do.  She also wants to see about getting on birth control pills. She had a miscarriage one month ago. She has not had a menstrual period since that time. She is sexually active. She reports Depo made her gain weight and Tri Sprintec made her bleed continuously. She has been on Ortho Tri Cyclen in the past with good results.  Review of Systems      Past Medical History  Diagnosis Date  . Anxiety   . Depression   . Miscarriage     Current Outpatient Prescriptions  Medication Sig Dispense Refill  . Prenatal Vit-Fe Fumarate-FA (PRENATAL MULTIVITAMIN) TABS tablet Take 1 tablet by mouth daily at 12 noon. 30 tablet 0   No current facility-administered medications for this visit.    Allergies  Allergen Reactions  . Morphine And Related Itching  . Percocet [Oxycodone-Acetaminophen] Itching  . Hydrocodone-Acetaminophen Hives    Family History  Problem Relation Age of Onset  . Cancer Father     melanoma  . Heart murmur Mother   . Cancer Maternal Grandfather     Lung    Social History   Social History  . Marital Status: Single    Spouse Name: N/A  . Number of Children: N/A  . Years of Education: N/A   Occupational History  . Not on file.   Social History Main Topics  . Smoking status: Current Every Day Smoker -- 0.50 packs/day for 4 years    Types:  Cigarettes  . Smokeless tobacco: Never Used  . Alcohol Use: 0.0 oz/week    0 Standard drinks or equivalent per week     Comment: occasional  . Drug Use: No  . Sexual Activity: Yes   Other Topics Concern  . Not on file   Social History Narrative     Constitutional: Denies fever, malaise, fatigue, headache or abrupt weight changes.  Respiratory: Denies difficulty breathing, shortness of breath, cough or sputum production.   Cardiovascular: Denies chest pain, chest tightness, palpitations or swelling in the hands or feet.  GU: Pt reports recent miscarriage. Denies urgency, frequency, pain with urination, burning sensation, blood in urine, odor or discharge. Neurological: Pt reports inattention and difficulty focusing. Denies dizziness, difficulty with speech or problems with balance and coordination.    No other specific complaints in a complete review of systems (except as listed in HPI above).  Objective:   Physical Exam   BP 112/70 mmHg  Pulse 76  Temp(Src) 98.3 F (36.8 C) (Oral)  Wt 111 lb (50.349 kg)  SpO2 98%  LMP 04/23/2015 Wt Readings from Last 3 Encounters:  07/02/15 111 lb (50.349 kg)  06/07/15 110 lb (49.896 kg)  02/13/15 111 lb (50.349 kg)    General: Appears her stated age, in NAD. Cardiovascular: Normal rate and rhythm.  S1,S2 noted.   Pulmonary/Chest: Normal effort and positive vesicular breath sounds. No respiratory distress. No wheezes, rales or ronchi noted.  Abdomen: Soft and nontender.  Neurological: Alert and oriented.  Psychiatric: Mood and affect normal.   BMET    Component Value Date/Time   NA 137 06/07/2015 1914   K 3.6 06/07/2015 1914   CL 104 06/07/2015 1914   CO2 26 06/07/2015 1914   GLUCOSE 91 06/07/2015 1914   BUN 8 06/07/2015 1914   CREATININE 0.69 06/07/2015 1914   CALCIUM 9.4 06/07/2015 1914   GFRNONAA >60 06/07/2015 1914   GFRAA >60 06/07/2015 1914    Lipid Panel  No results found for: CHOL, TRIG, HDL, CHOLHDL, VLDL,  LDLCALC  CBC    Component Value Date/Time   WBC 8.0 06/07/2015 1914   RBC 4.55 06/07/2015 1914   HGB 14.4 06/07/2015 1914   HCT 40.7 06/07/2015 1914   PLT 191 06/07/2015 1914   MCV 89.5 06/07/2015 1914   MCH 31.6 06/07/2015 1914   MCHC 35.3 06/07/2015 1914   RDW 12.6 06/07/2015 1914   LYMPHSABS 1.8 06/07/2015 1914   MONOABS 0.5 06/07/2015 1914   EOSABS 0.1 06/07/2015 1914   BASOSABS 0.0 06/07/2015 1914    Hgb A1C No results found for: HGBA1C      Assessment & Plan:   Miscarriage:  Will check serum HCG today to make sure it is returning to 0 If negative will give RX for Ortho Tri Cylcen   RTC as needed or if symptoms persist or worsen

## 2015-07-02 NOTE — Progress Notes (Signed)
   Subjective:    Patient ID: Jade Mills, female    DOB: Nov 26, 1990, 24 y.o.   MRN: [id number]  HPI Ms. Jade Mills is a 24 year old female who presents today for follow up of ADD.  She was diagnosed a child with ADD, she is interested in restarting treatment and was sent to Psych last visit. She says she never received a call from psych and never went.  Will set up a referral for psych today.  She would also like to get back on her oral contraceptives. She has taken tri-sprintec in the past and would like this again. She had a miscarriage last month and was unaware she was pregnant.    Review of Systems  Constitutional: Negative for fever, chills and fatigue.  HENT: Negative.   Respiratory: Negative for cough, shortness of breath and wheezing.   Cardiovascular: Negative for chest pain, palpitations and leg swelling.  Gastrointestinal: Negative.   Genitourinary: Negative.   Neurological: Negative for dizziness, light-headedness and numbness.   Family History  Problem Relation Age of Onset  . Cancer Father     melanoma  . Heart murmur Mother   . Cancer Maternal Grandfather     Lung   No current outpatient prescriptions on file prior to visit.   No current facility-administered medications on file prior to visit.       Objective:   Physical Exam  Constitutional: She is oriented to person, place, and time. She appears well-developed and well-nourished.  HENT:  Head: Normocephalic and atraumatic.  Eyes: Pupils are equal, round, and reactive to light.  Neck: Normal range of motion. Neck supple.  Cardiovascular: Normal rate, regular rhythm, normal heart sounds and intact distal pulses.   Pulmonary/Chest: Effort normal and breath sounds normal.  Musculoskeletal: Normal range of motion.  Neurological: She is alert and oriented to person, place, and time.   BP 112/70 mmHg  Pulse 76  Temp(Src) 98.3 F (36.8 C) (Oral)  Wt 111 lb (50.349 kg)  SpO2 98%  LMP 04/23/2015          Assessment & Plan:  1. ADHD Will refer to Psych.  2. Pregnancy prevention Will obtain serum hcg as patient never followed up with OB/GYN after miscarriage. Will rx for Ortho tri cyclen after results come back.  Rx for Ortho Tri Cyclen.

## 2015-07-03 LAB — HCG, SERUM, QUALITATIVE: PREG SERUM: UNDETERMINED — AB

## 2015-07-04 ENCOUNTER — Other Ambulatory Visit: Payer: Self-pay | Admitting: Internal Medicine

## 2015-07-04 LAB — HCG, QUANTITATIVE, PREGNANCY: hCG, Beta Chain, Quant, S: 4.4 m[IU]/mL

## 2015-07-04 MED ORDER — NORGESTIM-ETH ESTRAD TRIPHASIC 0.18/0.215/0.25 MG-25 MCG PO TABS: 1.0000 | ORAL_TABLET | Freq: Every day | ORAL | Status: AC

## 2016-07-03 IMAGING — US US OB COMP LESS 14 WK
1 series · 14 of 28 positions shown · non-contrast
Comparison: None.

CLINICAL DATA: Miscarriage, vaginal bleeding

EXAM:
OBSTETRIC <14 WK US AND TRANSVAGINAL OB US
TECHNIQUE: Both transabdominal and transvaginal ultrasound examinations were
performed for complete evaluation of the gestation as well as the
maternal uterus, adnexal regions, and pelvic cul-de-sac.
Transvaginal technique was performed to assess early pregnancy.

[Series 1: us ob comp less 14 wk · 0.17mm/px · 14 of 63 slices shown]
[im 3/63]
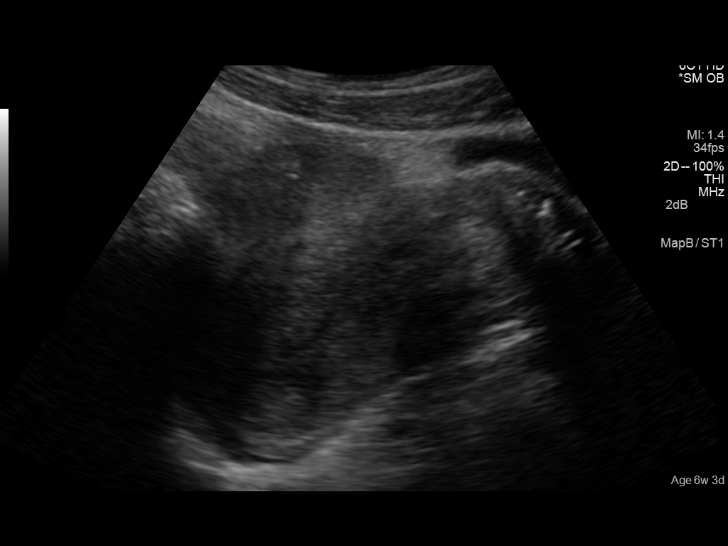
[im 7/63]
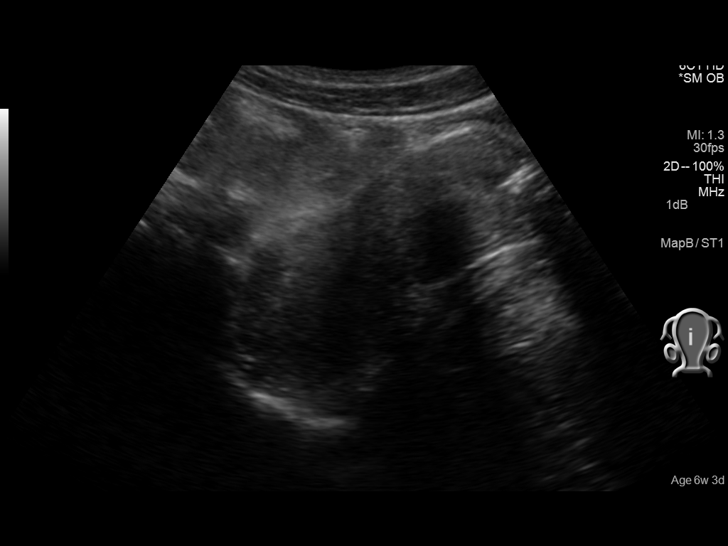
[im 12/63]
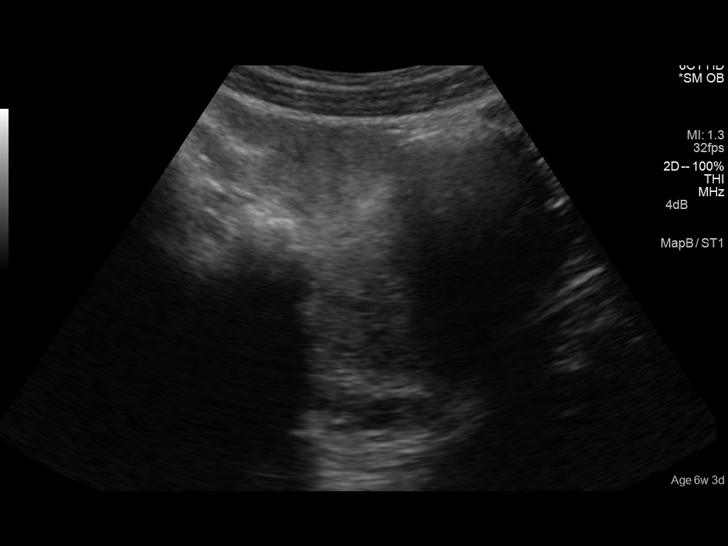
[im 17/63]
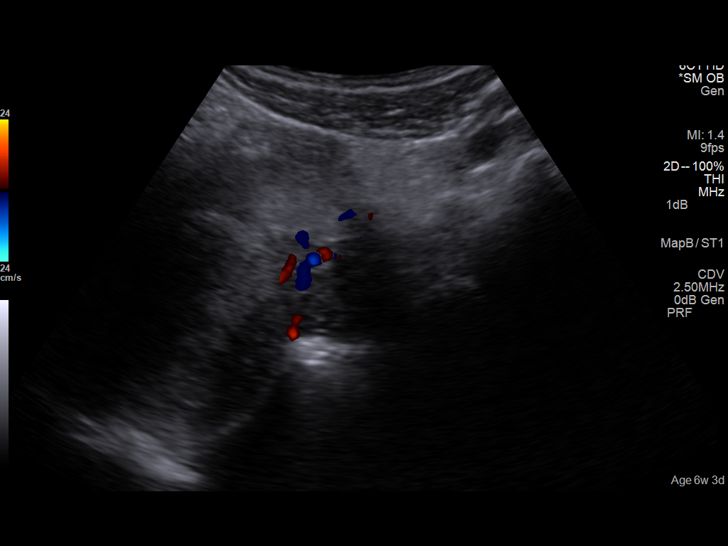
[im 21/63]
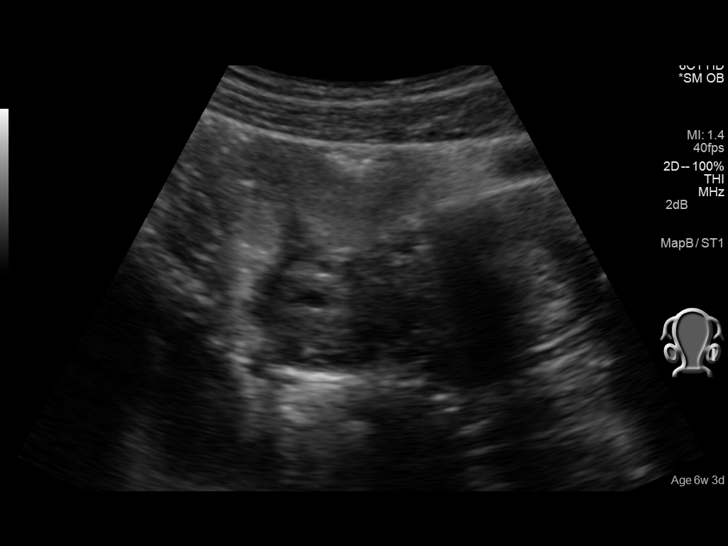
[im 26/63]
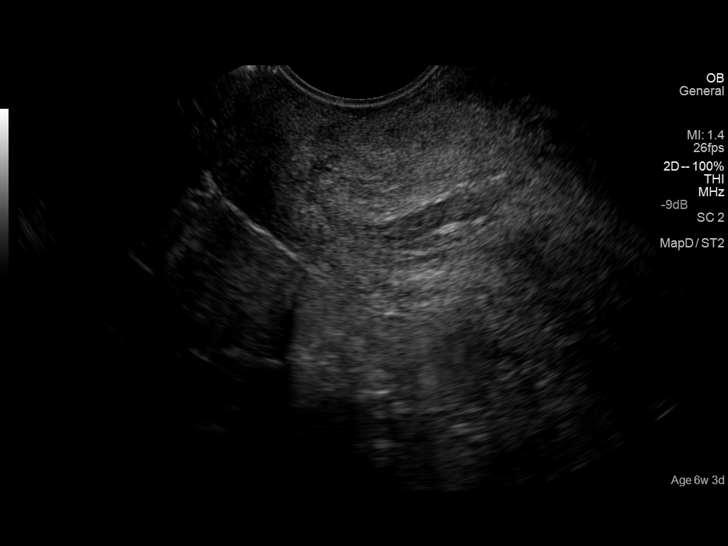
[im 30/63]
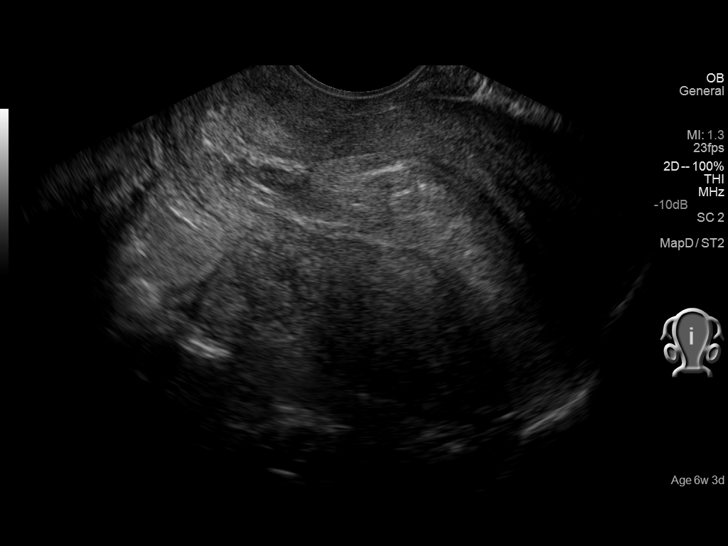
[im 35/63]
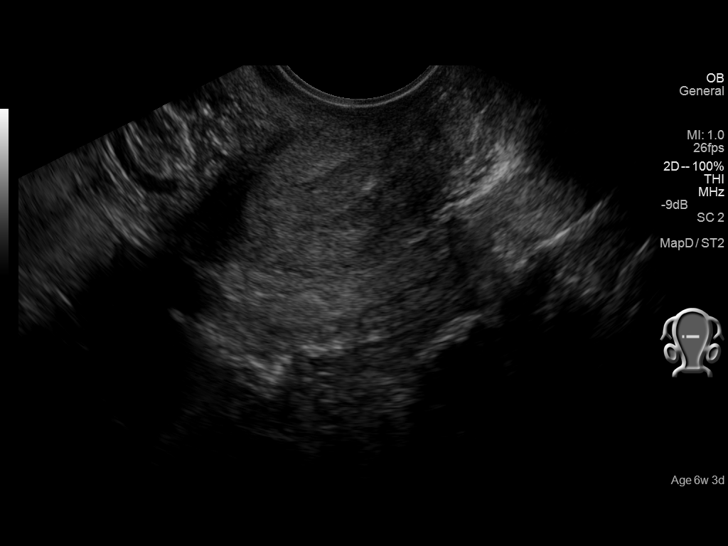
[im 40/63]
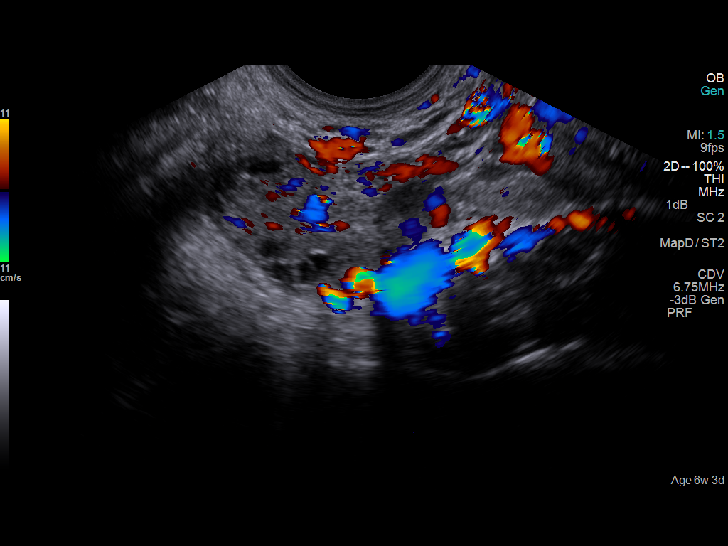
[im 44/63]
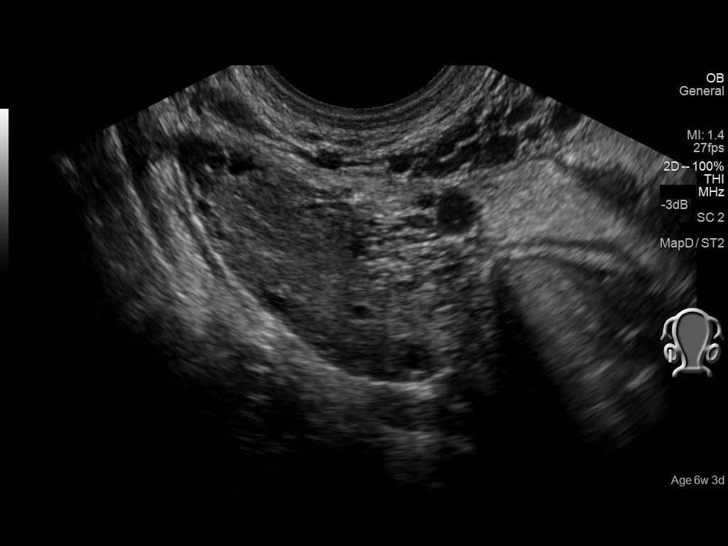
[im 49/63]
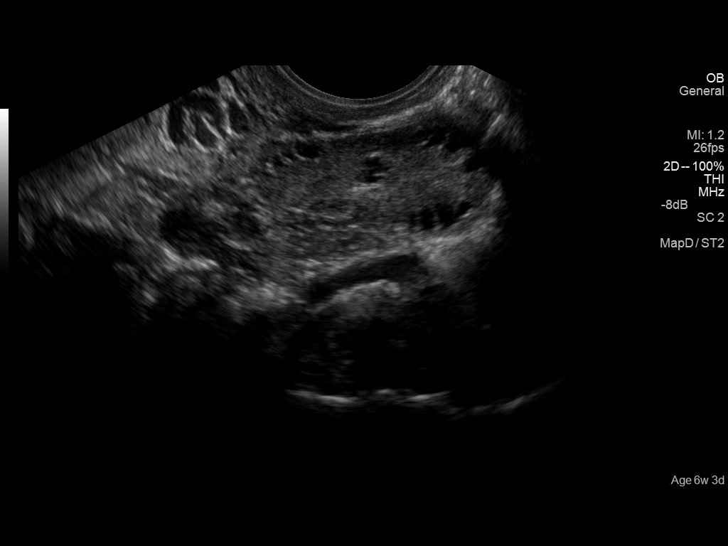
[im 53/63]
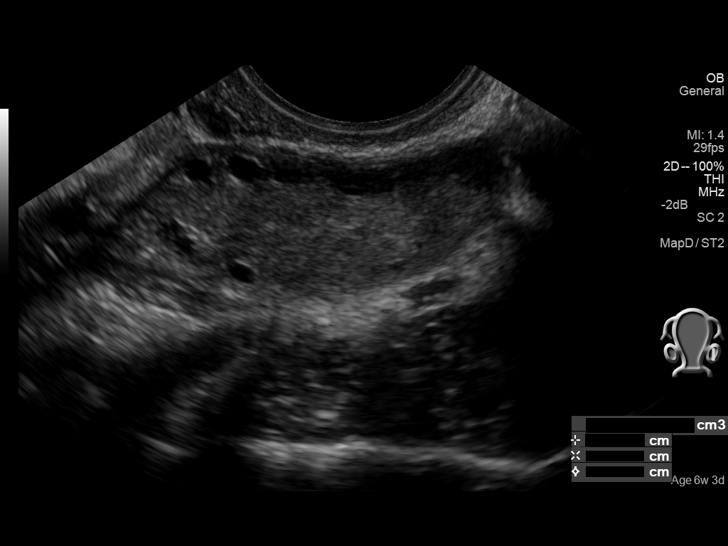
[im 58/63]
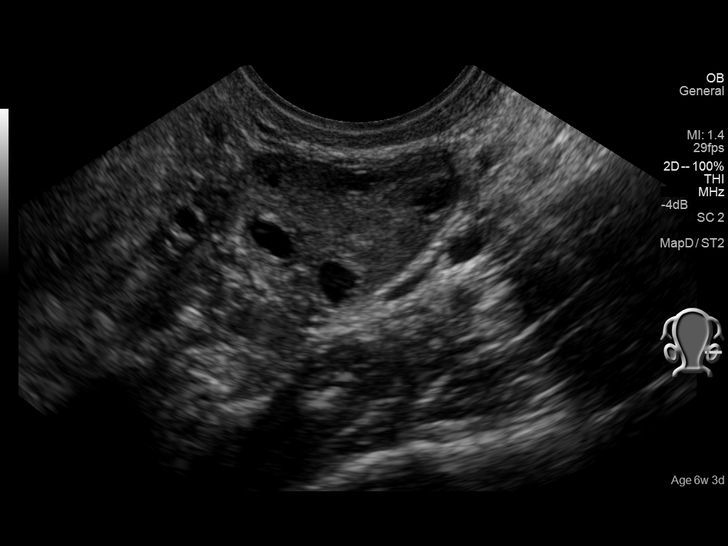
[im 63/63]
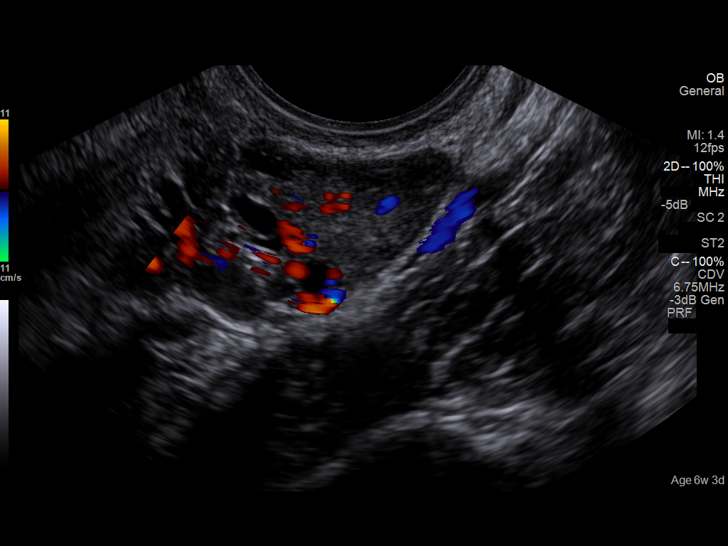

[14 of 28 positions shown; findings below may reference images not displayed]

FINDINGS: Intrauterine gestational sac: Not present

Yolk sac:  Not present

Embryo:  Not present

Cardiac Activity: Not present

Maternal uterus/adnexae: Heterogeneous endometrium. Normal uterine
size. Normal right ovary measuring 1.9 x 4.3 x 1.8 cm. Normal size
left ovary measuring 2.3 x 3.5 x 2.2 cm. Small amount of free fluid
around the left ovary.
IMPRESSION: No intrauterine pregnancy. Differential diagnosis includes pregnancy
too early to detect versus missed abortion versus ectopic pregnancy.
Recommend clinical correlation, serial quantitative beta HCGs,
ectopic precautions, and followup ultrasound as clinically
indicated.
